# Patient Record
Sex: Male | Born: 1970 | Race: White | Hispanic: No
Health system: Southern US, Community
[De-identification: ages and names within clinical notes are randomized; demographics above are authoritative.]

---

## 2017-11-02 ENCOUNTER — Encounter: Payer: Self-pay | Admitting: Emergency Medicine

## 2017-11-02 ENCOUNTER — Emergency Department: Payer: Medicaid Other

## 2017-11-02 ENCOUNTER — Emergency Department
Admission: EM | Admit: 2017-11-02 | Discharge: 2017-11-02 | Disposition: A | Discharge status: Other Acute Inpatient Hospital | Payer: Medicaid Other | Attending: Emergency Medicine | Admitting: Emergency Medicine

## 2017-11-02 ENCOUNTER — Other Ambulatory Visit: Payer: Self-pay

## 2017-11-02 ENCOUNTER — Ambulatory Visit (HOSPITAL_COMMUNITY)
Admission: AD | Admit: 2017-11-02 | Discharge: 2017-11-02 | Disposition: A | Discharge status: Home / Self Care | Payer: Medicaid Other | Source: Other Acute Inpatient Hospital | Attending: Emergency Medicine | Admitting: Emergency Medicine

## 2017-11-02 DIAGNOSIS — Y999 Unspecified external cause status: Secondary | ICD-10-CM | POA: Diagnosis not present

## 2017-11-02 DIAGNOSIS — S0181XA Laceration without foreign body of other part of head, initial encounter: Secondary | ICD-10-CM | POA: Insufficient documentation

## 2017-11-02 DIAGNOSIS — S065XAA Traumatic subdural hemorrhage with loss of consciousness status unknown, initial encounter: Secondary | ICD-10-CM

## 2017-11-02 DIAGNOSIS — S29001A Unspecified injury of muscle and tendon of front wall of thorax, initial encounter: Secondary | ICD-10-CM | POA: Insufficient documentation

## 2017-11-02 DIAGNOSIS — Z4659 Encounter for fitting and adjustment of other gastrointestinal appliance and device: Secondary | ICD-10-CM | POA: Diagnosis not present

## 2017-11-02 DIAGNOSIS — S065X9A Traumatic subdural hemorrhage with loss of consciousness of unspecified duration, initial encounter: Secondary | ICD-10-CM | POA: Diagnosis not present

## 2017-11-02 DIAGNOSIS — Y929 Unspecified place or not applicable: Secondary | ICD-10-CM | POA: Insufficient documentation

## 2017-11-02 DIAGNOSIS — Y939 Activity, unspecified: Secondary | ICD-10-CM | POA: Insufficient documentation

## 2017-11-02 DIAGNOSIS — S3991XA Unspecified injury of abdomen, initial encounter: Secondary | ICD-10-CM | POA: Diagnosis not present

## 2017-11-02 DIAGNOSIS — S0292XA Unspecified fracture of facial bones, initial encounter for closed fracture: Secondary | ICD-10-CM | POA: Diagnosis not present

## 2017-11-02 DIAGNOSIS — F1010 Alcohol abuse, uncomplicated: Secondary | ICD-10-CM | POA: Insufficient documentation

## 2017-11-02 DIAGNOSIS — W19XXXA Unspecified fall, initial encounter: Secondary | ICD-10-CM | POA: Diagnosis not present

## 2017-11-02 DIAGNOSIS — Z23 Encounter for immunization: Secondary | ICD-10-CM | POA: Diagnosis not present

## 2017-11-02 DIAGNOSIS — I6200 Nontraumatic subdural hemorrhage, unspecified: Secondary | ICD-10-CM | POA: Diagnosis present

## 2017-11-02 DIAGNOSIS — R4182 Altered mental status, unspecified: Secondary | ICD-10-CM | POA: Diagnosis not present

## 2017-11-02 DIAGNOSIS — S0990XA Unspecified injury of head, initial encounter: Secondary | ICD-10-CM | POA: Diagnosis present

## 2017-11-02 LAB — URINALYSIS, COMPLETE (UACMP) WITH MICROSCOPIC
Bilirubin Urine: NEGATIVE
GLUCOSE, UA: NEGATIVE mg/dL
Hgb urine dipstick: NEGATIVE
Ketones, ur: 5 mg/dL — AB
Leukocytes, UA: NEGATIVE
NITRITE: NEGATIVE
Protein, ur: NEGATIVE mg/dL
SPECIFIC GRAVITY, URINE: 1.006 (ref 1.005–1.030)
pH: 5 (ref 5.0–8.0)

## 2017-11-02 LAB — BLOOD GAS, ARTERIAL
ACID-BASE DEFICIT: 3.3 mmol/L — AB (ref 0.0–2.0)
Bicarbonate: 22.1 mmol/L (ref 20.0–28.0)
Drawn by: 449561
FIO2: 60
O2 SAT: 99.8 %
PEEP/CPAP: 5 cmH2O
PH ART: 7.35 (ref 7.350–7.450)
Patient temperature: 37
RATE: 16 resp/min
VT: 500 mL
pCO2 arterial: 40 mmHg (ref 32.0–48.0)
pO2, Arterial: 255 mmHg — ABNORMAL HIGH (ref 83.0–108.0)

## 2017-11-02 LAB — APTT: APTT: 26 s (ref 24–36)

## 2017-11-02 LAB — COMPREHENSIVE METABOLIC PANEL
ALBUMIN: 4.5 g/dL (ref 3.5–5.0)
ALT: 121 U/L — ABNORMAL HIGH (ref 17–63)
ANION GAP: 17 — AB (ref 5–15)
AST: 128 U/L — ABNORMAL HIGH (ref 15–41)
Alkaline Phosphatase: 56 U/L (ref 38–126)
BILIRUBIN TOTAL: 1 mg/dL (ref 0.3–1.2)
BUN: 9 mg/dL (ref 6–20)
CO2: 19 mmol/L — ABNORMAL LOW (ref 22–32)
Calcium: 8.5 mg/dL — ABNORMAL LOW (ref 8.9–10.3)
Chloride: 103 mmol/L (ref 101–111)
Creatinine, Ser: 0.58 mg/dL — ABNORMAL LOW (ref 0.61–1.24)
GFR calc Af Amer: >60 mL/min (ref >60)
GFR calc non Af Amer: >60 mL/min (ref >60)
GLUCOSE: 148 mg/dL — AB (ref 65–99)
POTASSIUM: 3.4 mmol/L — AB (ref 3.5–5.1)
SODIUM: 139 mmol/L (ref 135–145)
TOTAL PROTEIN: 7.5 g/dL (ref 6.5–8.1)

## 2017-11-02 LAB — CBC WITH DIFFERENTIAL/PLATELET
BASOS ABS: 0 10*3/uL (ref 0–0.1)
Basophils Relative: 0 %
EOS ABS: 0 10*3/uL (ref 0–0.7)
EOS PCT: 0 %
HCT: 37.9 % — ABNORMAL LOW (ref 40.0–52.0)
Hemoglobin: 12.9 g/dL — ABNORMAL LOW (ref 13.0–18.0)
Lymphocytes Relative: 5 %
Lymphs Abs: 0.6 10*3/uL — ABNORMAL LOW (ref 1.0–3.6)
MCH: 32.2 pg (ref 26.0–34.0)
MCHC: 34.1 g/dL (ref 32.0–36.0)
MCV: 94.3 fL (ref 80.0–100.0)
MONO ABS: 1.1 10*3/uL — AB (ref 0.2–1.0)
Monocytes Relative: 8 %
Neutro Abs: 11.4 10*3/uL — ABNORMAL HIGH (ref 1.4–6.5)
Neutrophils Relative %: 87 %
PLATELETS: 167 10*3/uL (ref 150–440)
RBC: 4.02 MIL/uL — ABNORMAL LOW (ref 4.40–5.90)
RDW: 14 % (ref 11.5–14.5)
WBC: 13.2 10*3/uL — ABNORMAL HIGH (ref 3.8–10.6)

## 2017-11-02 LAB — ETHANOL: Alcohol, Ethyl (B): 177 mg/dL — ABNORMAL HIGH (ref <10)

## 2017-11-02 LAB — PROTIME-INR
INR: 1.04
Prothrombin Time: 13.5 seconds (ref 11.4–15.2)

## 2017-11-02 LAB — LIPASE, BLOOD: Lipase: 62 U/L — ABNORMAL HIGH (ref 11–51)

## 2017-11-02 LAB — URINE DRUG SCREEN, QUALITATIVE (ARMC ONLY)
Amphetamines, Ur Screen: NOT DETECTED
BENZODIAZEPINE, UR SCRN: NOT DETECTED
Barbiturates, Ur Screen: NOT DETECTED
CANNABINOID 50 NG, UR ~~LOC~~: POSITIVE — AB
Cocaine Metabolite,Ur ~~LOC~~: NOT DETECTED
MDMA (ECSTASY) UR SCREEN: NOT DETECTED
Methadone Scn, Ur: NOT DETECTED
Opiate, Ur Screen: POSITIVE — AB
Phencyclidine (PCP) Ur S: NOT DETECTED
TRICYCLIC, UR SCREEN: NOT DETECTED

## 2017-11-02 LAB — SALICYLATE LEVEL

## 2017-11-02 LAB — ACETAMINOPHEN LEVEL: Acetaminophen (Tylenol), Serum: <10 ug/mL — ABNORMAL LOW (ref 10–30)

## 2017-11-02 MED ORDER — FENTANYL CITRATE (PF) 100 MCG/2ML IJ SOLN
150.0000 ug | Freq: Once | INTRAMUSCULAR | Status: AC
  Administered 2017-11-02: 150 ug via INTRAVENOUS

## 2017-11-02 MED ORDER — LEVETIRACETAM 500 MG/5ML IV SOLN
1000.0000 mg | Freq: Once | INTRAVENOUS | Status: AC
  Administered 2017-11-02: 1000 mg via INTRAVENOUS
  Filled 2017-11-02: qty 5

## 2017-11-02 MED ORDER — AMPICILLIN-SULBACTAM SODIUM 3 (2-1) G IJ SOLR
3.0000 g | Freq: Once | INTRAMUSCULAR | Status: AC
  Administered 2017-11-02: 3 g via INTRAVENOUS
  Filled 2017-11-02: qty 3

## 2017-11-02 MED ORDER — ETOMIDATE 2 MG/ML IV SOLN
10.0000 mg | Freq: Once | INTRAVENOUS | Status: AC
  Administered 2017-11-02: 10 mg via INTRAVENOUS

## 2017-11-02 MED ORDER — LIDOCAINE HCL (CARDIAC) 20 MG/ML IV SOLN
INTRAVENOUS | Status: AC
Start: 2017-11-02 — End: 2017-11-02
  Administered 2017-11-02: 100 mg via INTRAVENOUS
  Filled 2017-11-02: qty 5

## 2017-11-02 MED ORDER — IOPAMIDOL (ISOVUE-300) INJECTION 61%
100.0000 mL | Freq: Once | INTRAVENOUS | Status: AC | PRN
  Administered 2017-11-02: 100 mL via INTRAVENOUS

## 2017-11-02 MED ORDER — ROCURONIUM BROMIDE 50 MG/5ML IV SOLN
80.0000 mg | Freq: Once | INTRAVENOUS | Status: AC
  Administered 2017-11-02: 80 mg via INTRAVENOUS

## 2017-11-02 MED ORDER — PROPOFOL 1000 MG/100ML IV EMUL
INTRAVENOUS | Status: AC
  Filled 2017-11-02: qty 100

## 2017-11-02 MED ORDER — TETANUS-DIPHTH-ACELL PERTUSSIS 5-2.5-18.5 LF-MCG/0.5 IM SUSP
0.5000 mL | Freq: Once | INTRAMUSCULAR | Status: AC
  Administered 2017-11-02: 0.5 mL via INTRAMUSCULAR
  Filled 2017-11-02: qty 0.5

## 2017-11-02 MED ORDER — PROPOFOL 1000 MG/100ML IV EMUL
5.0000 ug/kg/min | Freq: Once | INTRAVENOUS | Status: AC
  Administered 2017-11-02: 4.41 ug/kg/min via INTRAVENOUS

## 2017-11-02 MED ORDER — LIDOCAINE HCL (CARDIAC) 20 MG/ML IV SOLN
100.0000 mg | Freq: Once | INTRAVENOUS | Status: AC
Start: 2017-11-02 — End: 2017-11-02
  Administered 2017-11-02: 100 mg via INTRAVENOUS

## 2017-11-02 MED ORDER — LIDOCAINE HCL (CARDIAC) 20 MG/ML IV SOLN: 10.0000 mg | Freq: Once | INTRAVENOUS | Status: DC

## 2017-11-02 NOTE — ED Notes (Signed)
MD performed intubation with this RN, Rosey Batheresa, RN, Orvilla Fusommy, EDT-P, and Weston BrassNick, RT, at bedside.

## 2017-11-02 NOTE — ED Notes (Signed)
CDS arrived to hospital to assess patient.

## 2017-11-02 NOTE — ED Notes (Signed)
ACSO and CID at bedside now Meagan RN, Dr. Rachell CiproSchavietz, and myself at bedside as well.

## 2017-11-02 NOTE — ED Notes (Signed)
Neurosurgeon at bedside at this time.

## 2017-11-02 NOTE — ED Provider Notes (Addendum)
[facility] Emergency Department Provider Note  ____________________________________________   First MD Initiated Contact with Patient 11/02/17 1143     (approximate)  I have reviewed the triage vital signs and the nursing notes.   HISTORY  Chief Complaint Altered Mental Status   HPI Elijah Young is a 47 y.o. male with an unknown medical history who is presenting to the emergency department today with altered mental status.  EMS brought in the patient from a friend's apartment.  EMS reports that bystanders had reported that the patient had been drinking heavily after having relationship trouble with his significant other.  EMS reported that the patient was transported from a friend's house.  EMS found the patient unresponsive and with beer can spread around the room.  There is also blood coming from the nares upon their arrival.  However, no obvious other signs of trauma were identified.  Patient was then transported to the emergency department via EMS on supplemental oxygen.  Upon arrival, the patient is somnolent and unable to give any history.   History reviewed. No pertinent past medical history.  There are no active problems to display for this patient.   History reviewed. No pertinent surgical history.  Prior to Admission medications   Not on File    Allergies Patient has no known allergies.  No family history on file.  Social History Social History   Tobacco Use  . Smoking status: Unknown If Ever Smoked  Substance Use Topics  . Alcohol use: Yes    Comment: Pt presents with "heavy ETOH use" per EMS  . Drug use: No    Comment: Unknown    Review of Systems   Level 5 caveat secondary to altered mentation.   ____________________________________________   PHYSICAL EXAM:  VITAL SIGNS: ED Triage Vitals  Enc Vitals Group     BP 11/02/17 1204 (!) 144/75     Pulse Rate 11/02/17 1204 62     Resp 11/02/17 1204 20     Temp --    Temp src --      SpO2 11/02/17 1204 100 %     Weight 11/02/17 1156 200 lb (90.7 kg)     Height --      Head Circumference --      Peak Flow --      Pain Score --      Pain Loc --      Pain Edu? --      Excl. in GC? --     Constitutional: GCS of 3.   Eyes: Conjunctivae are normal.  Pupils are 5-6 mm and dilated bilaterally. Head: No cranial bruising noted.  Over the left cheek there is a 3 cm superficial laceration with the wound edges well approximated. Nose: Small amount of blood from the bilateral nares.  However, no nasal septal hematoma visualized. Mouth/Throat: Mucous membranes are moist.  Unable to examine the pharynx because when I placed the Yankauer just past the lips the patient bit down on the suction tube. Neck: No stridor.   Cardiovascular: Normal rate, regular rhythm. Grossly normal heart sounds.  Respiratory: Normal respiratory effort.  No retractions. Lungs CTAB. Gastrointestinal: Soft. No distention.  Musculoskeletal: No lower extremity edema.  No joint effusions.  No deformities the bilateral upper or lower extremities. Neurologic: GCS of 3.  Blinks when the eyelashes are touched on the left side.  However, there is otherwise no response to painful stimuli including sternal rub and pressure on the nailbeds. Skin: 3  cm laceration to the left mandible just lateral to the chin.  It is about 1 similar deep without exposed bone.  There is no active bleeding.   ____________________________________________   LABS (all labs ordered are listed, but only abnormal results are displayed)  Labs Reviewed  CBC WITH DIFFERENTIAL/PLATELET - Abnormal; Notable for the following components:      Result Value   WBC 13.2 (*)    RBC 4.02 (*)    Hemoglobin 12.9 (*)    HCT 37.9 (*)    Neutro Abs 11.4 (*)    Lymphs Abs 0.6 (*)    Monocytes Absolute 1.1 (*)    All other components within normal limits  COMPREHENSIVE METABOLIC PANEL - Abnormal; Notable for the following components:    Potassium 3.4 (*)    CO2 19 (*)    Glucose, Bld 148 (*)    Creatinine, Ser 0.58 (*)    Calcium 8.5 (*)    AST 128 (*)    ALT 121 (*)    Anion gap 17 (*)    All other components within normal limits  LIPASE, BLOOD - Abnormal; Notable for the following components:   Lipase 62 (*)    All other components within normal limits  ETHANOL  ACETAMINOPHEN LEVEL  SALICYLATE LEVEL  URINE DRUG SCREEN, QUALITATIVE ([facility] ONLY)  URINALYSIS, COMPLETE (UACMP) WITH MICROSCOPIC  PROTIME-INR  APTT  BLOOD GAS, ARTERIAL   ____________________________________________  EKG  ED ECG REPORT I, Arelia Longest, the attending physician, personally viewed and interpreted this ECG.   Date: 11/02/2017  EKG Time: 1213  Rate: 148  Rhythm: sinus tachycardia  Axis: Normal  Intervals:Incomplete right bundle branch block and left anterior fascicular block.  Prolonged QT interval at 575.  ST&T Change: No ST segment elevation or depression.  T wave inversions in 1, aVL as well as V2.  ____________________________________________  RADIOLOGY  CT of the brain with large right-sided subdural hematoma with herniation..  No acute findings on the chest and abdomen x-rays.  Endotracheal tube is 3 cm above the carina. ____________________________________________   PROCEDURES  Procedure(s) performed:    Procedure Name: Intubation Date/Time: 11/02/2017 12:45 PM Performed by: Myrna Blazer, MD Pre-anesthesia Checklist: Patient identified, Patient being monitored, Emergency Drugs available, Timeout performed and Suction available Oxygen Delivery Method: Non-rebreather mask Preoxygenation: Pre-oxygenation with 100% oxygen Induction Type: Rapid sequence Ventilation: Mask ventilation without difficulty Laryngoscope Size: Glidescope and 4 Grade View: Grade I Tube size: 7.5 mm Number of attempts: 1 Placement Confirmation: ETT inserted through vocal cords under direct vision,  CO2 detector and Breath  sounds checked- equal and bilateral Tube secured with: ETT holder Comments: Neuro protective intubation was done with 150 mcg of fentanyl as well as 100 mg of IV lidocaine.    .Critical Care Performed by: Myrna Blazer, MD Authorized by: Myrna Blazer, MD   Critical care provider statement:    Critical care time (minutes):  35   Critical care was necessary to treat or prevent imminent or life-threatening deterioration of the following conditions:  CNS failure or compromise   Critical care was time spent personally by me on the following activities:  Ordering and performing treatments and interventions, ordering and review of laboratory studies, re-evaluation of patient's condition, pulse oximetry and discussions with consultants Comments:     I called Dr. Marcell Barlow before the patient was intubated.  Dr. Marcell Barlow of neurosurgery came to the patient's bedside and examined him.  Agrees with GCS of 3.  Says  that this injury is likely not survivable and that the patient may be transferred for evaluation of palliative care organ donation.  We are currently working on transfer of the patient to a [facility].  Dr. Marcell BarlowYarborough was able to examine the patient prior to the patient being sedated or paralyzed so he was able to assess the patient without pharmacologic alterations.    Critical Care performed:   ____________________________________________   INITIAL IMPRESSION / ASSESSMENT AND PLAN / ED COURSE  Pertinent labs & imaging results that were available during my care of the patient were reviewed by me and considered in my medical decision making (see chart for details).  ----------------------------------------- 12:48 PM on 11/02/2017 -----------------------------------------  Right after intubation the patient became tachycardic to the 140s but is now with a heart rate of 83 which appears sinus on the monitor.  He will be pan scanned for other traumatic  injuries.  Will be given antibiotics as well as a tetanus shot.  Likely also sutured the facial wound as well.  Clinical Course as of Nov 02 1450  Thu Nov 02, 2017  1250 Patient arrived to the emergency department without any identification.  On his demographic section on the EMR he does not have any contacts listed.  We will work through [employer], retailpossibly local law enforcement as well as hospital administration to obtain contact information for friends and family.  [DS]  1310 Patient with initial pH of 7.35.  Will increase the respiratory rate 19 and we will decrease his oxygen concentration of 40%.  [DS]    Clinical Course User Index [DS] Myrna BlazerSchaevitz, Matheu Ploeger Matthew, MD  Correction to above.  CO2 of 40 instead of pH of 7.35.  Respiratory rate increased to 22.  Furthermore, there were no recommendations for mannitol or hypertonic saline from Dr. Marcell BarlowYarborough as he deems the patient's injury unsurvivable. ----------------------------------------- 12:52 PM on 11/02/2017 -----------------------------------------  I was able to obtain the patient's home phone number [facility].  I called and left a message with the main emergency department phone number.  We are awaiting callback at this time.  ----------------------------------------- 1:00 PM on 11/02/2017 -----------------------------------------  I discussed the case with the patient's friend, Mr. Ronal FearGriggs, who says that the patient had been staying at his house and he was along the called EMS this morning.  He said the patient was drinking last night and then woke up this morning "feeling strange."  Mr. Tammy SoursGreg says the patient then had a fall and fell backward and then went to sleep on the couch after his fall.  However, Mr. Tammy SoursGreg says the patient did not awake and began having snoring respirations which is when the ambulance was called.  ----------------------------------------- 2:52 PM on  11/02/2017 -----------------------------------------  I discussed the case with the APP, Tifi, who accepted the patient on [facility]  I have still not heard back from family at this time.  Patient remains without sedation and unresponsive.  [facility]. ____________________________________________   FINAL CLINICAL IMPRESSION(S) / ED DIAGNOSES  Final diagnoses:  Encounter for orogastric (OG) tube placement  Subdural hemorrhage.  Brain herniation.  Facial laceration.  Facial fractures.    NEW MEDICATIONS STARTED DURING THIS VISIT:  New Prescriptions   No medications on file     Note:  This document was prepared using Dragon voice recognition software and may include unintentional dictation errors.     Lindalou Soltis, Myra Rudeavid Matthew, MD  11/02/17 1455  Discussed the case with the patient's brother, [place].  Mr. Dan Maker says that the patient has struggled with alcoholism off and on for years.  We discussed the patient's diagnosis as well as grave prognosis and transferred to [facility].  I also discussed with the brother that I felt that it was more harm than benefit to suture his chin laceration at this time and the patient's brother agrees.  The patient's brother says that he will be going to [facility].  We discussed likely timeline of several hours at this point before the patient arrived to [facility].  The patient further notes that he has a bed assignment we are waiting for transport at this time.  The patient's brother did not have any further information about the events of the fall.  However, he did say that he spoke to the patient yesterday and he seemed to be at his baseline when they talked.    Myrna Blazer, MD 11/02/17 [phone]

## 2017-11-02 NOTE — ED Notes (Signed)
[facility] at this [facility].

## 2017-11-02 NOTE — ED Notes (Signed)
Pt transported to CT by this RN.

## 2017-11-02 NOTE — Progress Notes (Signed)
Chaplain was directed to the patient's. Chaplain inquired about possible family. No relative is here at this time. Chaplain will follow up.

## 2017-11-02 NOTE — ED Triage Notes (Signed)
Pt presents [facility] ED via [facility]. Per EMS pt was found this morning by a friend that he was staying with last night to be unresponsive. EMS reports unresponsive to pain, however if patient's eye lashes are messed with some eye lid fluttering, and when ammonia inhalent placed in patient's nostrils tear response noted by EMS. Per EMS VSS en route. When patient first arrived to ED, pt taken immediately to CT due to condition, MD accompanied this RN and Tommy, EDT-P. Pupils noted to be dilated and unresponsive to light.

## 2017-11-02 NOTE — ED Notes (Signed)
EMTALA reviewed by this RN.  

## 2017-11-02 NOTE — ED Notes (Signed)
[facility] is awaiting transfer to [facility] via [facility].Pt is intubated and stable at this time. RN will continue to monitor.

## 2017-11-02 NOTE — Progress Notes (Signed)
Chaplain offered silent prayer and presence. 

## 2017-11-02 NOTE — ED Notes (Signed)
This RN notified [facility] of pt leaving in transit to them. RN will sign off care at this time.

## 2017-11-02 NOTE — Consult Note (Addendum)
Referring Physician:  No referring provider defined for this encounter.  Primary Physician:  No primary care provider on file.  Chief Complaint:  Altered mental status  History of Present Illness: 11/02/2017 Elijah Young is a 47 y.o. male who presents with the chief complaint of altered mental status.  History is unclear, but patient is a 47 yo male who was found down and unresponsive on arrival.  He was evaluated by ED physician, who obtained CT emergently.  NSGY consulted due to large R SDH.  The patient is nonresponsive, so no history is obtainable from the patient.  Prior to my arrival, he had not received any paralytics or sedatives.  Review of Systems:  A 10 point review of systems is negative, except for the pertinent positives and negatives detailed in the HPI.  Past Medical History: Unobtainable  Past Surgical History: Unobtainable  Allergies: Allergies as of 11/02/2017  . (No Known Allergies)    Medications: Not obtainable  Social History: Social History   Tobacco Use  . Smoking status: Unknown If Ever Smoked  Substance Use Topics  . Alcohol use: Yes    Comment: Pt presents with "heavy ETOH use" per EMS  . Drug use: No    Comment: Unknown    Family Medical History: No family history on file.  Physical Examination: Vitals:   11/02/17 1204  BP: (!) 144/75  Pulse: 62  Resp: 20  SpO2: 100%     General: Patient is unconscious.  He is not reacting to external stimuli.  Psychiatric:     Cannot assess  Head:  See below  ENT:  Oral mucosa appears well hydrated. With some bloody discharge from the R naris  Neck:   Supple.  Full range of motion.  Respiratory: Patient is sonorously breathing, not protecting his airway  Extremities: No edema.  Vascular: Palpable pulses in dorsal pedal vessels.  Skin:   On exposed skin, there are no abnormal skin lesions.  NEUROLOGICAL:  General: Comatose, critical appearing  He does not open eyes,  regard, or follow commands. Pupils 7mm on R and 8mm on L, nonreactive.  There is an ovoid appearance of the L eye.   No corneal on the R.  Minimal corneal on L.  No doll's response. Myoclonus is present in the jaw. No gag response noted.  No peripheral response to central or peripheral stimuli.   No sensory testing or specific motor testing is possible.  Imaging: CT Head at 1153 AM on 11/02/2017 IMPRESSION: Large acute RIGHT frontal/parietal/temporal subdural hematoma up to 28 mm thick with significant mass effect upon the RIGHT hemisphere and approximately 23 mm of RIGHT to LEFT midline shift.  Associated probable sub fall seen herniation.  Additional scattered tiny foci of subarachnoid blood at both hemispheres.  Fractures of the lateral wall LEFT orbit, inferior wall LEFT orbit, lateral wall LEFT maxillary sinus, and questionably LEFT zygoma.  No definite acute cervical spine abnormalities identified on exam limited by patient motion.  Critical Value/emergent results were called by telephone at the time of interpretation on 11/02/2017 at 12:08 pm to Dr. Gladstone PihAVID SCHAEVITZ , who verbally acknowledged these results.   Electronically Signed   By: Ulyses SouthwardMark  Boles M.D.   On: 11/02/2017 12:09    I have personally reviewed the images and agree with the above interpretation.  Assessment and Plan: Mr. [name] is a pleasant 47 y.o. male with large R subdural hematoma with current GCS of E1V1M1=3.  He was intubated after my evaluation.  This is not a survivable subdural hematoma, given its size and clinical presentation.  Though I cannot declare brain death currently, he will likely progress to brain death imminently.  There is no family with which to discuss this at the present moment.    I have fully evaluated this critically ill patient, with additional discussion with the ER physician and transfer center for a total of 30 minutes.  I have recommended transfer to a higher level  of care where appropriate management and likely brain death declaration could occur.  If that is not possible, I would ultimately recommend discussion with family for withdrawal of care in the emergency department.     Elijah Young K. Myer Haff MD, MPHS Dept. of Neurosurgery  Please note that this patient is non-responsive, thus much of the clinical information is not obtainable.

## 2017-11-07 MED FILL — Fentanyl Citrate Preservative Free (PF) Inj 100 MCG/2ML: INTRAMUSCULAR | Qty: 2 | Status: AC

## 2017-11-09 MED ORDER — GLYCOPYRROLATE 0.2 MG/ML IJ SOLN
0.20 | INTRAMUSCULAR | Status: DC
Start: 2017-11-08 — End: 2017-11-09

## 2017-11-09 MED ORDER — LORAZEPAM 2 MG/ML IJ SOLN
2.00 | INTRAMUSCULAR | Status: DC
Start: ? — End: 2017-11-09

## 2017-11-09 MED ORDER — ATROPINE SULFATE 1 % OP SOLN
2.00 | OPHTHALMIC | Status: DC
Start: ? — End: 2017-11-09

## 2017-11-09 MED ORDER — GENERIC EXTERNAL MEDICATION
Status: DC
Start: ? — End: 2017-11-09

## 2017-11-09 MED ORDER — ACETAMINOPHEN 650 MG RE SUPP
650.00 mg | RECTAL | Status: DC
Start: ? — End: 2017-11-09

## 2017-11-09 MED ORDER — SCOPOLAMINE 1 MG/3DAYS TD PT72
2.00 | MEDICATED_PATCH | TRANSDERMAL | Status: DC
Start: 2017-11-11 — End: 2017-11-09

## 2017-11-09 MED ORDER — HYDROMORPHONE HCL 1 MG/ML IJ SOLN
0.40 | INTRAMUSCULAR | Status: DC
Start: ? — End: 2017-11-09

## 2017-11-09 MED ORDER — LORAZEPAM 2 MG/ML IJ SOLN
1.00 mg | INTRAMUSCULAR | Status: DC
Start: ? — End: 2017-11-09

## 2017-11-09 MED ORDER — CHLORHEXIDINE GLUCONATE 0.12 % MT SOLN
15.00 | OROMUCOSAL | Status: DC
Start: 2017-11-08 — End: 2017-11-09

## 2017-11-09 MED ORDER — ACETAMINOPHEN 650 MG RE SUPP
650.00 | RECTAL | Status: DC
Start: 2017-11-08 — End: 2017-11-09

## 2017-11-09 MED ORDER — GENERIC EXTERNAL MEDICATION
1.00 | Status: DC
Start: ? — End: 2017-11-09

## 2017-11-17 DEATH — deceased

## 2018-07-13 IMAGING — CT CT ABD-PELV W/ CM
2 of 5 series · 13 of 36 positions shown, 16 images · IV contrast (iopamidol)
Comparison: Chest radiograph 11/02/2017

CLINICAL DATA: Found unresponsive. Possible chest or abdominal
trauma.

EXAM:
CT CHEST, ABDOMEN, AND PELVIS WITH CONTRAST
TECHNIQUE: Multidetector CT imaging of the chest, abdomen and pelvis was
performed following the standard protocol during bolus
administration of intravenous contrast.
CONTRAST:  100mL I3PXPP-LXX IOPAMIDOL (I3PXPP-LXX) INJECTION 61%

[Series 2: cap with · axial · 0.69mm/px · z∈[-851,-291]mm · 10 of 138 slices shown, 13 images]
[im 13/138  mediastinal]
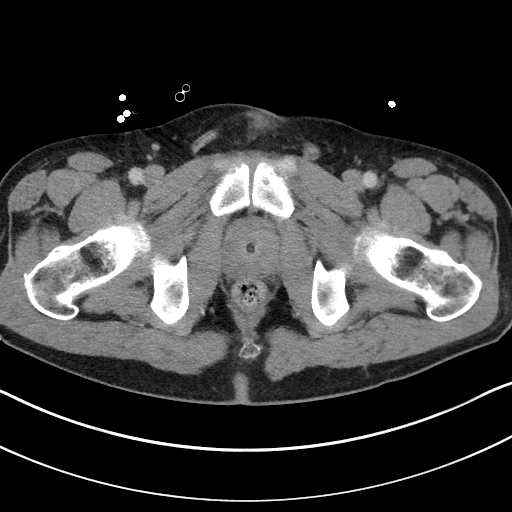
[im 13/138  lung]
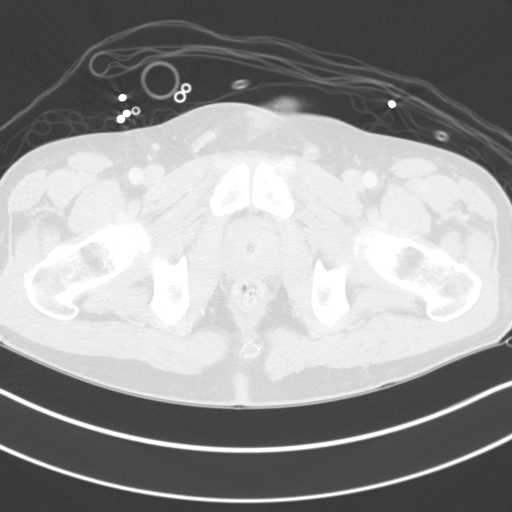
[im 25/138  lung]
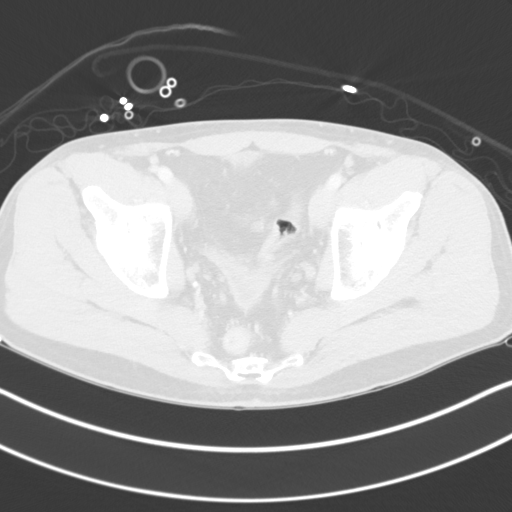
[im 38/138  lung]
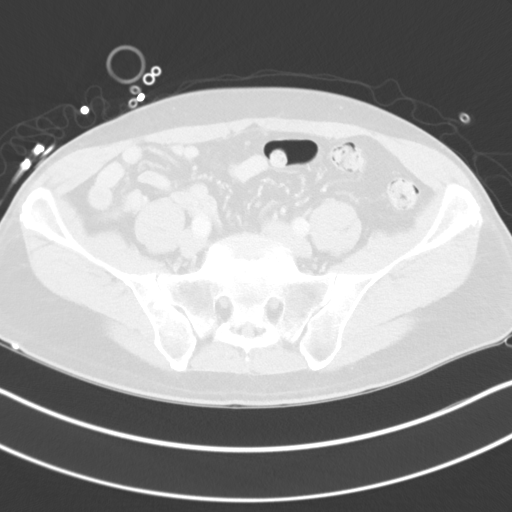
[im 50/138  lung]
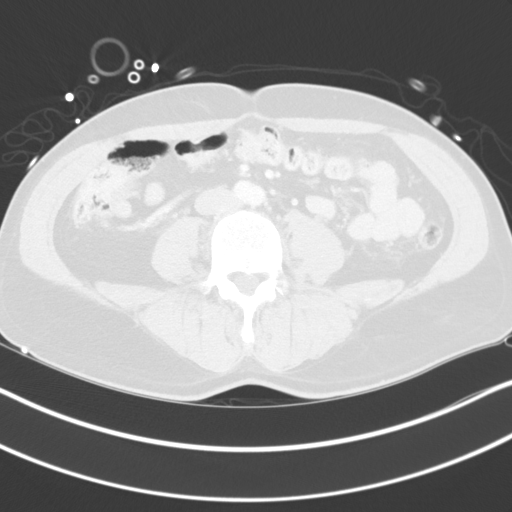
[im 63/138  mediastinal]
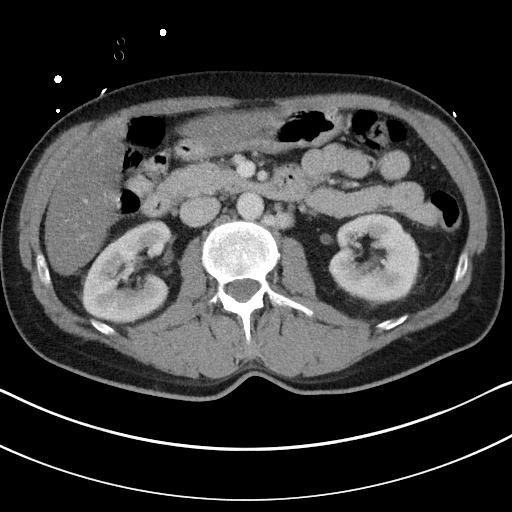
[im 63/138  lung]
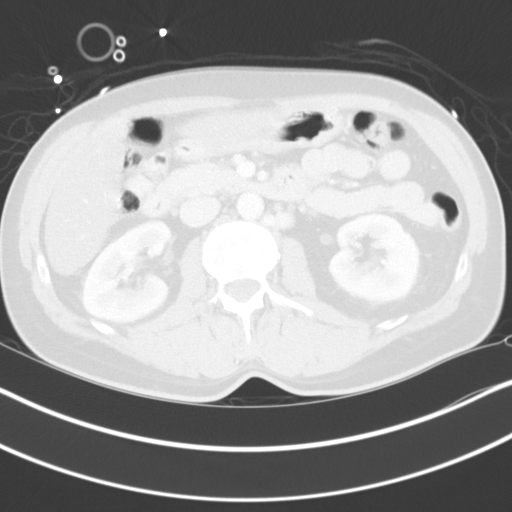
[im 75/138  lung]
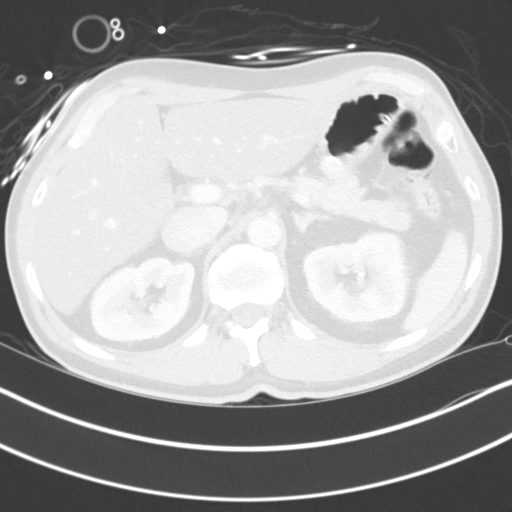
[im 88/138  lung]
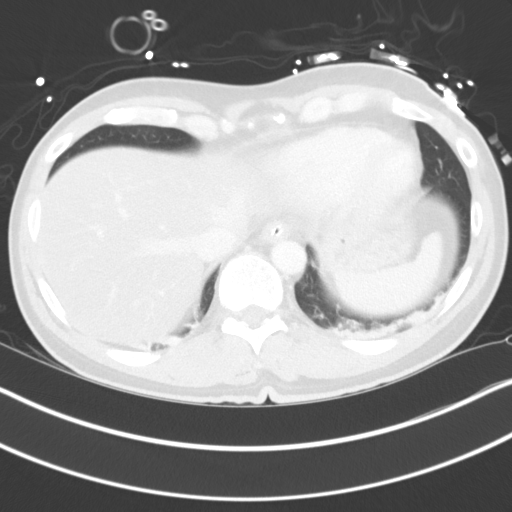
[im 100/138  lung]
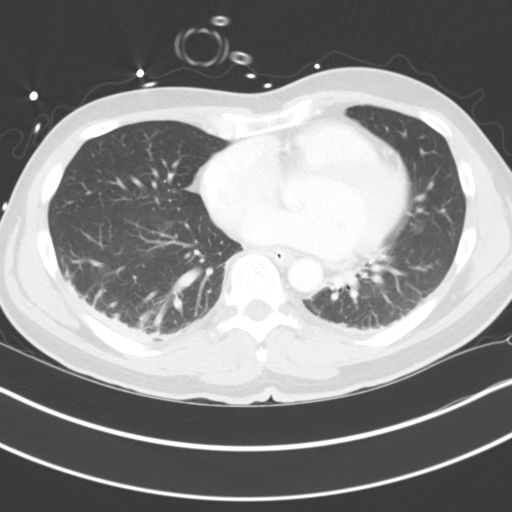
[im 113/138  mediastinal]
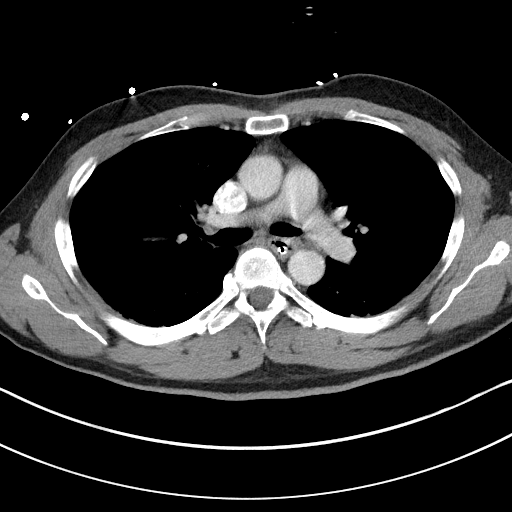
[im 113/138  lung]
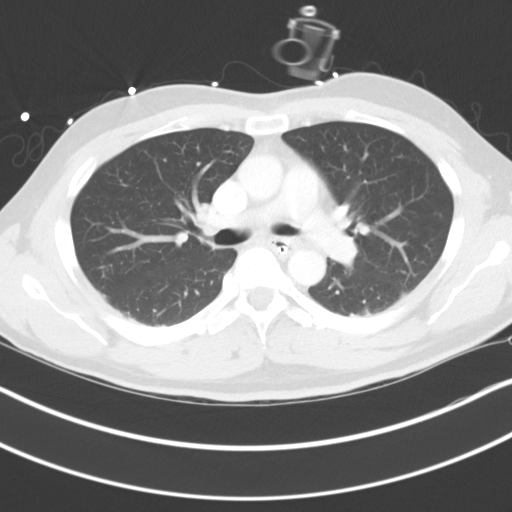
[im 125/138  lung]
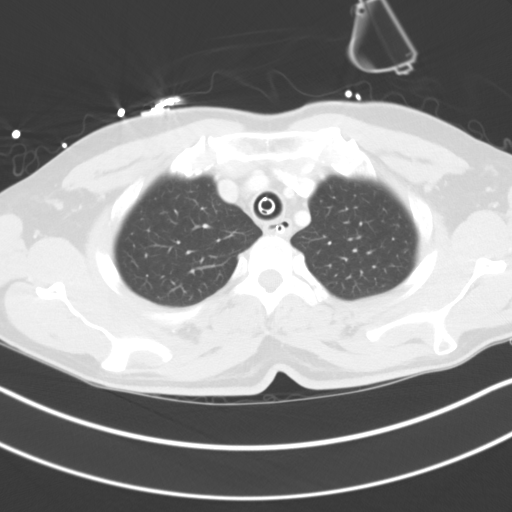

[Series 5: coronals · coronal · 0.71mm/px · 3 of 122 slices shown]
[im 25/122  lung]
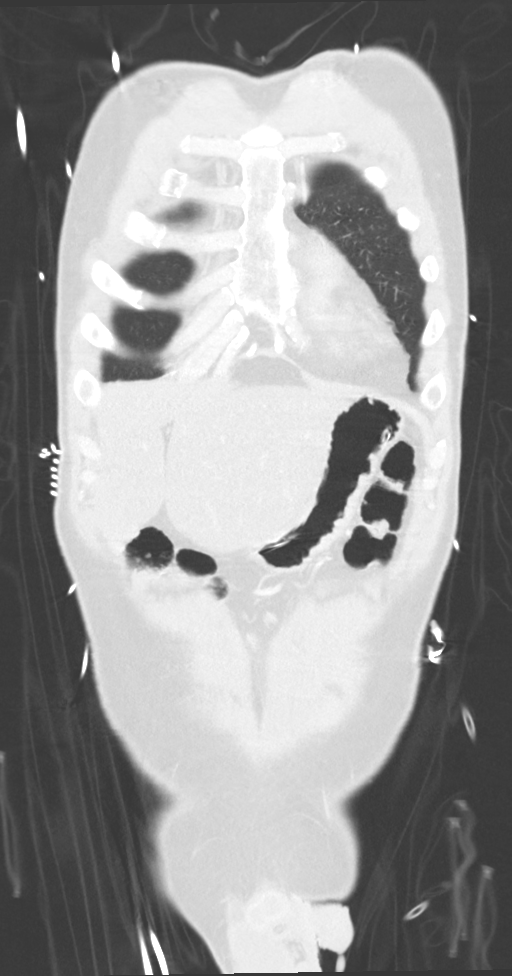
[im 49/122  lung]
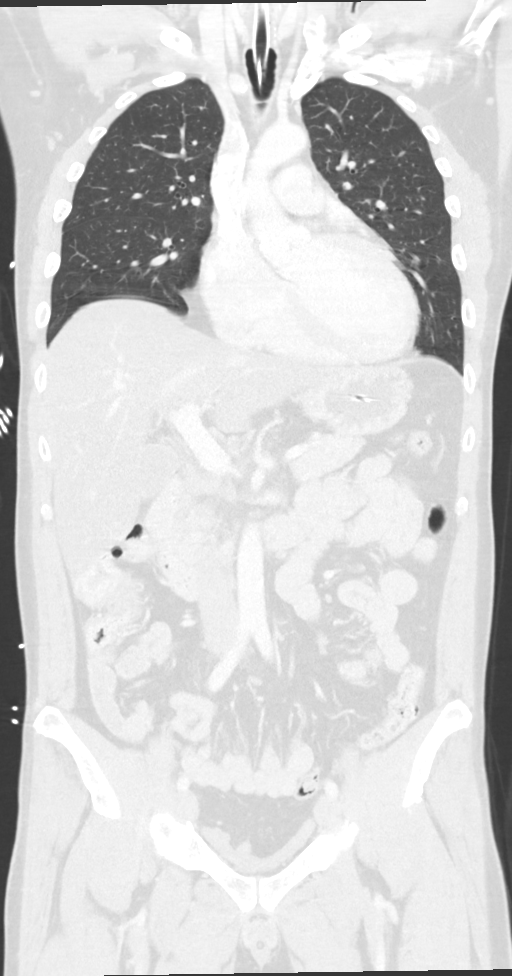
[im 73/122  lung]
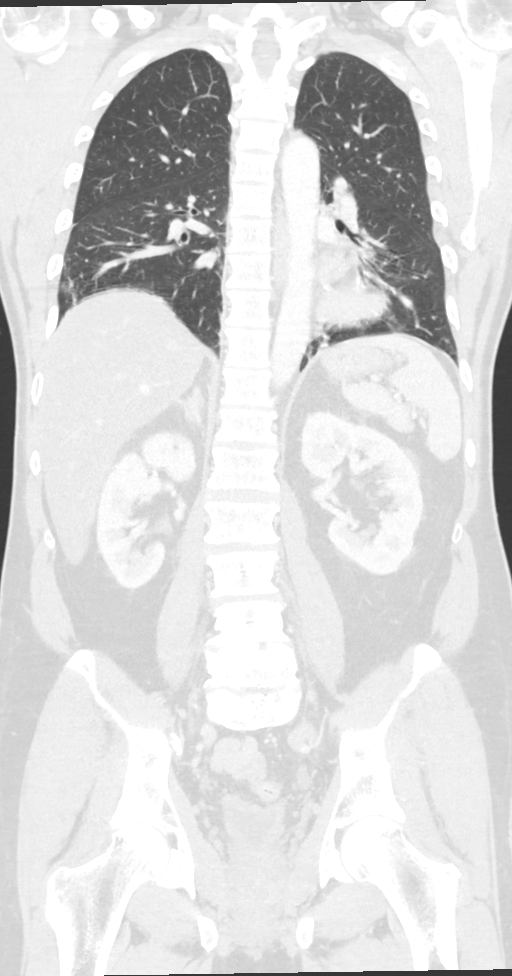

[13 of 36 positions shown; findings below may reference images not displayed]

FINDINGS: CT CHEST FINDINGS

Cardiovascular: No significant vascular findings. Normal heart size.
No pericardial effusion. Endotracheal tube in satisfactory position.

Mediastinum/Nodes: No enlarged mediastinal, hilar, or axillary lymph
nodes. Thyroid gland, trachea, and esophagus demonstrate no
significant findings.

Lungs/Pleura: Hypoventilatory changes versus aspiration in the
dependent portions of the bilateral lung bases.

Musculoskeletal: No chest wall mass or suspicious bone lesions
identified.

CT ABDOMEN PELVIS FINDINGS

Hepatobiliary: Hepatic steatosis. Normal appearance of the
gallbladder.

Pancreas: Unremarkable. No pancreatic ductal dilatation or
surrounding inflammatory changes.

Spleen: Normal in size without focal abnormality.

Adrenals/Urinary Tract: Adrenal glands are unremarkable. Kidneys are
normal, without renal calculi, focal lesion, or hydronephrosis.
Bladder is decompressed around urinary Foley and contains small
amount of gas, possibly postprocedural.

Stomach/Bowel: Enteric catheter terminates in the region of the
pylorus. Stomach is within normal limits. Appendix appears normal.
No evidence of bowel wall thickening, distention, or inflammatory
changes.

Vascular/Lymphatic: No significant vascular findings are present. No
enlarged abdominal or pelvic lymph nodes.

Reproductive: Prostate is unremarkable.

Other: No abdominal wall hernia or abnormality. No abdominopelvic
ascites.

Musculoskeletal: No fracture is seen. L4-L5 and L5-S1 advanced
osteoarthritic changes with remodeling of the vertebral bodies.
Milder likely degenerative remodeling of T11 and T12 vertebral
bodies.
IMPRESSION: No evidence of acute traumatic injury to the chest, abdomen or
pelvis.

Hypoventilatory changes versus aspiration in bilateral lung bases.

Hepatic steatosis.

Small amount of gas within the urinary bladder, possibly
postprocedural, post placement of Foley catheter.

## 2018-07-13 IMAGING — DX DG CHEST 1V
1 series · 1 of 1 positions shown · non-contrast
Comparison: None.

CLINICAL DATA: Hypoxia

EXAM:
CHEST 1 VIEW

[chest ap]
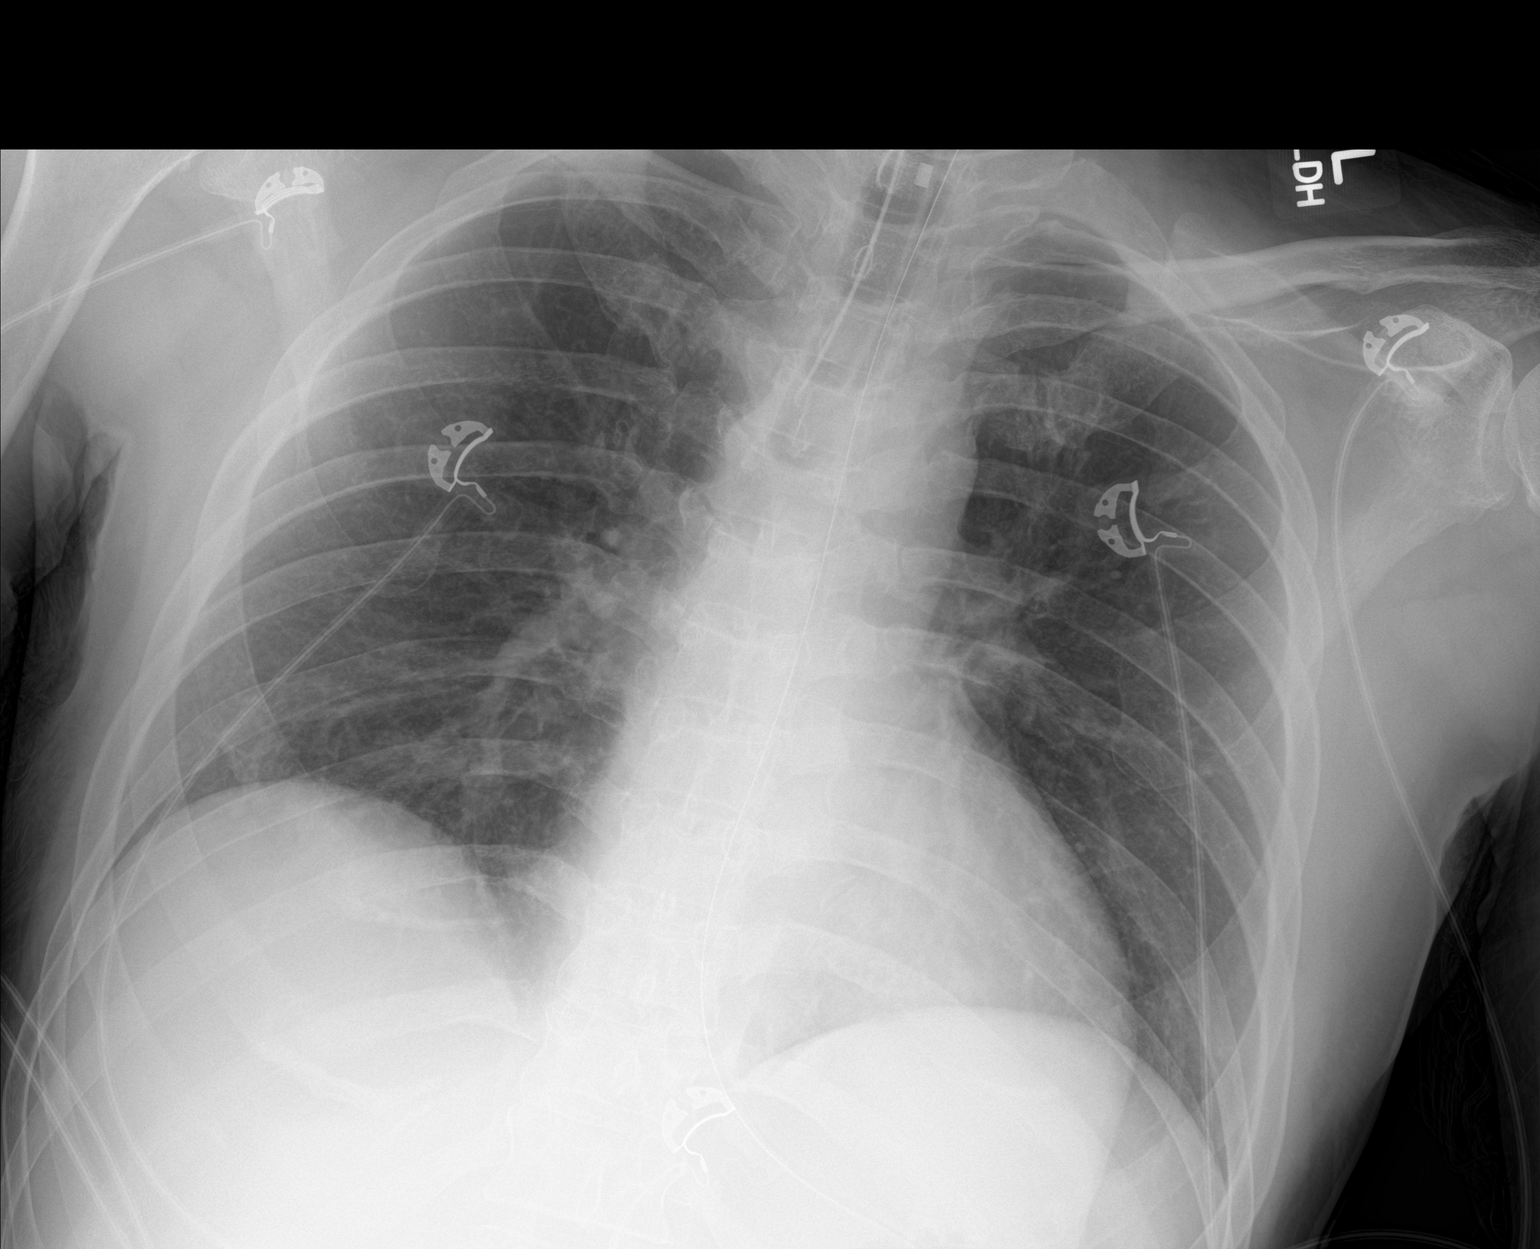

[1 of 1 positions shown; findings below may reference images not displayed]

FINDINGS: Endotracheal tube tip is 3.0 cm above the carina. Nasogastric tube
tip and side port are below the diaphragm. No pneumothorax. Lungs
are clear. Heart size and pulmonary vascularity are normal. No
adenopathy. No bone lesions.
IMPRESSION: Tube positions as described without pneumothorax. No edema or
consolidation. Heart size normal.
